# Patient Record
Sex: Male | Born: 1985 | Race: White | Hispanic: No | Marital: Single | Smoking: Never smoker
Health system: Southern US, Community
[De-identification: ages and names within clinical notes are randomized; demographics above are authoritative.]

## PROBLEM LIST (undated history)

## (undated) HISTORY — PX: OTHER SURGICAL HISTORY: SHX169

## (undated) HISTORY — PX: CHOLECYSTECTOMY: SHX55

---
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
## 2007-04-26 ENCOUNTER — Emergency Department (HOSPITAL_COMMUNITY): Admission: EM | Admit: 2007-04-26 | Discharge: 2007-04-26 | Payer: Self-pay | Admitting: Emergency Medicine

## 2013-10-06 ENCOUNTER — Emergency Department (HOSPITAL_COMMUNITY)
Admission: EM | Admit: 2013-10-06 | Discharge: 2013-10-06 | Disposition: A | Discharge status: Home / Self Care | Payer: Self-pay | Attending: Emergency Medicine | Admitting: Emergency Medicine

## 2013-10-06 ENCOUNTER — Encounter (HOSPITAL_COMMUNITY): Payer: Self-pay | Admitting: Emergency Medicine

## 2013-10-06 DIAGNOSIS — Z79899 Other long term (current) drug therapy: Secondary | ICD-10-CM | POA: Insufficient documentation

## 2013-10-06 DIAGNOSIS — R110 Nausea: Secondary | ICD-10-CM | POA: Insufficient documentation

## 2013-10-06 DIAGNOSIS — K219 Gastro-esophageal reflux disease without esophagitis: Secondary | ICD-10-CM | POA: Insufficient documentation

## 2013-10-06 LAB — COMPREHENSIVE METABOLIC PANEL
ALT: 18 U/L (ref 0–53)
AST: 15 U/L (ref 0–37)
Albumin: 3.8 g/dL (ref 3.5–5.2)
Alkaline Phosphatase: 37 U/L — ABNORMAL LOW (ref 39–117)
BUN: 18 mg/dL (ref 6–23)
Chloride: 102 mEq/L (ref 96–112)
GFR calc non Af Amer: >90 mL/min (ref >90)
Potassium: 4 mEq/L (ref 3.5–5.1)
Sodium: 137 mEq/L (ref 135–145)
Total Bilirubin: 0.9 mg/dL (ref 0.3–1.2)
Total Protein: 7.5 g/dL (ref 6.0–8.3)

## 2013-10-06 LAB — CBC WITH DIFFERENTIAL/PLATELET
Basophils Absolute: 0 10*3/uL (ref 0.0–0.1)
Basophils Relative: 0 % (ref 0–1)
Eosinophils Absolute: 0.1 10*3/uL (ref 0.0–0.7)
HCT: 44.5 % (ref 39.0–52.0)
Hemoglobin: 15.7 g/dL (ref 13.0–17.0)
MCHC: 35.3 g/dL (ref 30.0–36.0)
Monocytes Relative: 8 % (ref 3–12)
Neutro Abs: 4.3 10*3/uL (ref 1.7–7.7)
Neutrophils Relative %: 60 % (ref 43–77)
Platelets: 172 10*3/uL (ref 150–400)
RDW: 12.2 % (ref 11.5–15.5)

## 2013-10-06 LAB — LIPASE, BLOOD: Lipase: 33 U/L (ref 11–59)

## 2013-10-06 MED ORDER — ONDANSETRON HCL 4 MG/2ML IJ SOLN
4.0000 mg | Freq: Once | INTRAMUSCULAR | Status: AC
  Administered 2013-10-06: 4 mg via INTRAVENOUS
  Filled 2013-10-06: qty 2

## 2013-10-06 MED ORDER — GI COCKTAIL ~~LOC~~
30.0000 mL | Freq: Once | ORAL | Status: AC
  Administered 2013-10-06: 30 mL via ORAL
  Filled 2013-10-06: qty 30

## 2013-10-06 MED ORDER — OMEPRAZOLE 20 MG PO CPDR: 20.0000 mg | DELAYED_RELEASE_CAPSULE | Freq: Every day | ORAL | Status: DC

## 2013-10-06 MED ORDER — SODIUM CHLORIDE 0.9 % IV BOLUS (SEPSIS)
1000.0000 mL | Freq: Once | INTRAVENOUS | Status: AC
  Administered 2013-10-06: 1000 mL via INTRAVENOUS

## 2013-10-06 NOTE — ED Provider Notes (Signed)
CSN: [id number]     Arrival date & time 10/06/13  5621 History   None    Chief Complaint  Patient presents with  . Abdominal Pain    epigastric    (Consider location/radiation/quality/duration/timing/severity/associated sxs/prior Treatment) HPI  Patient presents to the ED with complaints of epigastric cramping/pain since 3:30am today. He states that he intermittently gets pains like these and will take a anti acid pill and get relief. This morning he took one and it didn't help. He also feels a bit nauseous. The cramping radiates into his back. He denies ever needing to go to the ER for similar symptoms and denies having had abdominal surgery in his medical history. He has not had any vomiting, diarrhea, coughing, sore throat, weakness, change in appetite. He says that for work he travels a lot and eats a lot of fast food. Last night he had BBQ.  History reviewed. No pertinent past medical history. History reviewed. No pertinent past surgical history. No family history on file. History  Substance Use Topics  . Smoking status: Never Smoker   . Smokeless tobacco: Not on file  . Alcohol Use: No    Review of Systems The patient denies anorexia, fever, weight loss,, vision loss, decreased hearing, hoarseness, chest pain, syncope, dyspnea on exertion, peripheral edema, balance deficits, hemoptysis, abdominal pain, melena, hematochezia, severe indigestion/heartburn, hematuria, incontinence, genital sores, muscle weakness, suspicious skin lesions, transient blindness, difficulty walking, depression, unusual weight change, abnormal bleeding, enlarged lymph nodes, angioedema, and breast masses.  Allergies  Review of patient's allergies indicates no known allergies.  Home Medications   Current Outpatient Rx  Name  Route  Sig  Dispense  Refill  . calcium carbonate (TUMS) 500 MG chewable tablet   Oral   Chew 2 tablets by mouth daily as needed for heartburn.         . famotidine (PEPCID) 20  MG tablet   Oral   Take 20 mg by mouth daily as needed for heartburn.         Marland Kitchen omeprazole (PRILOSEC) 20 MG capsule   Oral   Take 1 capsule (20 mg total) by mouth daily.   30 capsule   0    BP 160/93  Pulse 51  Temp(Src) 98.3 F (36.8 C) (Oral)  Resp 16  SpO2 100% Physical Exam  Nursing note and vitals reviewed. Constitutional: He appears well-developed and well-nourished. No distress.  HENT:  Head: Normocephalic and atraumatic.  Eyes: Pupils are equal, round, and reactive to light.  Neck: Normal range of motion. Neck supple.  Cardiovascular: Normal rate and regular rhythm.   Pulmonary/Chest: Effort normal.  Abdominal: Soft. Bowel sounds are normal. He exhibits no distension, no fluid wave and no ascites. There is tenderness (mild) in the epigastric area. There is no rigidity, no rebound, no guarding, no CVA tenderness, no tenderness at McBurney's point and negative Murphy's sign.  Neurological: He is alert.  Skin: Skin is warm and dry.    ED Course  Procedures (including critical care time) Labs Review Labs Reviewed  COMPREHENSIVE METABOLIC PANEL - Abnormal; Notable for the following:    Glucose, Bld 115 (*)    Alkaline Phosphatase 37 (*)    All other components within normal limits  CBC WITH DIFFERENTIAL  LIPASE, BLOOD   Imaging Review No results found.  EKG Interpretation     Ventricular Rate:  48 PR Interval:  153 QRS Duration: 113 QT Interval:  433 QTC Calculation: 387 R Axis:   -30 Text  Interpretation:  Sinus bradycardia Borderline IVCD with LAD Borderline T abnormalities, inferior leads No old tracing to compare            MDM   1. Acid reflux       Date: 10/06/2013  Rate: 48  Rhythm: sinus brady  QRS Axis: normal  Intervals: normal  ST/T Wave abnormalities: normal  Conduction Disutrbances:none  Narrative Interpretation:   Old EKG Reviewed: none available     Patient had 100% relief with the GI cocktail and Zofran. Feeling  much better. Labs are reassuring and I dont know feel that any imaging is warranted at this time. Referral to GI given and an Rx for Prilosec daily.  27 y.o.Gordon Robertson's evaluation in the Emergency Department is complete. It has been determined that no acute conditions requiring further emergency intervention are present at this time. The patient/guardian have been advised of the diagnosis and plan. We have discussed signs and symptoms that warrant return to the ED, such as changes or worsening in symptoms.  Vital signs are stable at discharge. Filed Vitals:   10/06/13 0627  BP: 160/93  Pulse: 51  Temp: 98.3 F (36.8 C)  Resp: 16    Patient/guardian has voiced understanding and agreed to follow-up with the PCP or specialist.      Dorthula Matas, PA-C 10/06/13 7829

## 2013-10-06 NOTE — Progress Notes (Signed)
Met Patient at bedside.Role of Case Manager explained to Patient . Patient verbalizes his understanding.Patient reports abdominal discomfort is the reason for today's ED visit. Patient Demographic sheet did not list a PCP though patient reports he is established with Dr Ricki Miller at [facility]Patients health Insurance does not start till  Next week.Patient provided with a prescription discount card for [facility] and resource sheet for the program.Education also provided on the [place] [address] and the  [employer]cription savings club.Patient receptive to today's education and reports he will follow up with his PCP.

## 2013-10-06 NOTE — ED Provider Notes (Signed)
Medical screening examination/treatment/procedure(s) were performed by non-physician practitioner and as supervising physician I was immediately available for consultation/collaboration.  EKG Interpretation     Ventricular Rate:  48 PR Interval:  153 QRS Duration: 113 QT Interval:  433 QTC Calculation: 387 R Axis:   -30 Text Interpretation:  Sinus bradycardia Borderline IVCD with LAD Borderline T abnormalities, inferior leads No old tracing to compare              Shanna Cisco, MD 10/06/13 1538

## 2013-10-06 NOTE — ED Notes (Signed)
Patient states epigastric pain starting tonight at 0330, patient states some associated nausea, patient with previous episodes of same, patient states intermittent pain radiating into back

## 2013-10-13 ENCOUNTER — Emergency Department (HOSPITAL_COMMUNITY)
Admission: EM | Admit: 2013-10-13 | Discharge: 2013-10-13 | Disposition: A | Discharge status: Home / Self Care | Payer: Self-pay | Attending: Emergency Medicine | Admitting: Emergency Medicine

## 2013-10-13 ENCOUNTER — Other Ambulatory Visit: Payer: Self-pay

## 2013-10-13 ENCOUNTER — Encounter (HOSPITAL_COMMUNITY): Payer: Self-pay | Admitting: Emergency Medicine

## 2013-10-13 ENCOUNTER — Emergency Department (HOSPITAL_COMMUNITY): Payer: Self-pay

## 2013-10-13 DIAGNOSIS — Z79899 Other long term (current) drug therapy: Secondary | ICD-10-CM | POA: Insufficient documentation

## 2013-10-13 DIAGNOSIS — K8020 Calculus of gallbladder without cholecystitis without obstruction: Secondary | ICD-10-CM | POA: Insufficient documentation

## 2013-10-13 DIAGNOSIS — R110 Nausea: Secondary | ICD-10-CM | POA: Insufficient documentation

## 2013-10-13 DIAGNOSIS — M545 Low back pain, unspecified: Secondary | ICD-10-CM | POA: Insufficient documentation

## 2013-10-13 DIAGNOSIS — K219 Gastro-esophageal reflux disease without esophagitis: Secondary | ICD-10-CM | POA: Insufficient documentation

## 2013-10-13 DIAGNOSIS — Z7982 Long term (current) use of aspirin: Secondary | ICD-10-CM | POA: Insufficient documentation

## 2013-10-13 LAB — CBC
HCT: 44.5 % (ref 39.0–52.0)
Hemoglobin: 16.3 g/dL (ref 13.0–17.0)
MCHC: 36.6 g/dL — ABNORMAL HIGH (ref 30.0–36.0)
MCV: 83 fL (ref 78.0–100.0)
Platelets: 201 10*3/uL (ref 150–400)
RDW: 12 % (ref 11.5–15.5)

## 2013-10-13 LAB — COMPREHENSIVE METABOLIC PANEL
ALT: 19 U/L (ref 0–53)
AST: 19 U/L (ref 0–37)
Albumin: 4.1 g/dL (ref 3.5–5.2)
Alkaline Phosphatase: 39 U/L (ref 39–117)
BUN: 17 mg/dL (ref 6–23)
CO2: 23 mEq/L (ref 19–32)
Creatinine, Ser: 0.8 mg/dL (ref 0.50–1.35)
GFR calc Af Amer: >90 mL/min (ref >90)
Glucose, Bld: 114 mg/dL — ABNORMAL HIGH (ref 70–99)
Potassium: 4.2 mEq/L (ref 3.5–5.1)
Total Bilirubin: 0.9 mg/dL (ref 0.3–1.2)
Total Protein: 8 g/dL (ref 6.0–8.3)

## 2013-10-13 LAB — LIPASE, BLOOD: Lipase: 19 U/L (ref 11–59)

## 2013-10-13 MED ORDER — GI COCKTAIL ~~LOC~~
30.0000 mL | Freq: Once | ORAL | Status: AC
  Administered 2013-10-13: 30 mL via ORAL
  Filled 2013-10-13: qty 30

## 2013-10-13 MED ORDER — SUCRALFATE 1 GM/10ML PO SUSP: 1.0000 g | Freq: Three times a day (TID) | ORAL | Status: AC

## 2013-10-13 MED ORDER — SODIUM CHLORIDE 0.9 % IV BOLUS (SEPSIS)
1000.0000 mL | Freq: Once | INTRAVENOUS | Status: AC
  Administered 2013-10-13: 1000 mL via INTRAVENOUS

## 2013-10-13 MED ORDER — OMEPRAZOLE 20 MG PO CPDR: 20.0000 mg | DELAYED_RELEASE_CAPSULE | Freq: Every day | ORAL | Status: AC

## 2013-10-13 MED ORDER — ONDANSETRON HCL 4 MG/2ML IJ SOLN
4.0000 mg | Freq: Once | INTRAMUSCULAR | Status: AC
  Administered 2013-10-13: 4 mg via INTRAVENOUS
  Filled 2013-10-13: qty 2

## 2013-10-13 MED ORDER — SUCRALFATE 1 GM/10ML PO SUSP
1.0000 g | Freq: Three times a day (TID) | ORAL | Status: DC
  Administered 2013-10-13: 1 g via ORAL
  Filled 2013-10-13 (×3): qty 10

## 2013-10-13 MED ORDER — HYDROCODONE-ACETAMINOPHEN 5-325 MG PO TABS: 1.0000 | ORAL_TABLET | Freq: Four times a day (QID) | ORAL | Status: AC | PRN

## 2013-10-13 MED ORDER — MORPHINE SULFATE 4 MG/ML IJ SOLN
4.0000 mg | Freq: Once | INTRAMUSCULAR | Status: AC
  Administered 2013-10-13: 4 mg via INTRAVENOUS
  Filled 2013-10-13: qty 1

## 2013-10-13 NOTE — ED Provider Notes (Signed)
ECG interpretation   Date: 10/13/2013  Rate: 46  Rhythm: sinus bradycardia  QRS Axis: normal  Intervals: normal  ST/T Wave abnormalities: normal  Conduction Disutrbances: none  Narrative Interpretation:   Old EKG Reviewed: No significant changes noted     Lyanne Co, MD 10/13/13 1147

## 2013-10-13 NOTE — ED Notes (Signed)
Upper abd pain and cp and lower back and has been vomiting all night

## 2013-10-13 NOTE — ED Provider Notes (Signed)
CSN: [id number]     Arrival date & time 10/13/13  1130 History   First MD Initiated Contact with Patient 10/13/13 1136     Chief Complaint  Patient presents with  . Chest Pain  . Abdominal Pain  . Back Pain   (Consider location/radiation/quality/duration/timing/severity/associated sxs/prior Treatment) HPI Comments:  The patient is a 27 year old male presenting to the emergency department for recurrent epigastric abdominal pain with radiation to back. Patient states he was evaluated one week ago in the emergency department and diagnosed with acid reflux and told to start Prilosec. Patient states he has been taken medication as prescribed, does note he missed one dose on Friday. He states this morning he woke up with worsening pain. Describing it as constant sharp without aggravating factors. Patient states his pain is alleviated with stretching. He endorses non-bloody non-bilious vomiting last evening w/ continued nausea today. Patient states he has continued to eat foods such as [place] and [place]. He states that he was told not to change his diet, despite review of discharge instructions. He has also not followed up with GI.   Patient is a 28 y.o. male presenting with chest pain, abdominal pain, and back pain.  Chest Pain Associated symptoms: abdominal pain, back pain and nausea   Associated symptoms: no fever, no shortness of breath and not vomiting   Abdominal Pain Associated symptoms: chest pain and nausea   Associated symptoms: no chills, no fever, no shortness of breath and no vomiting   Back Pain Associated symptoms: abdominal pain and chest pain   Associated symptoms: no fever     History reviewed. No pertinent past medical history. History reviewed. No pertinent past surgical history. No family history on file. History  Substance Use Topics  . Smoking status: Never Smoker   . Smokeless tobacco: Not on file  . Alcohol Use: No    Review of Systems  Constitutional: Negative  for fever and chills.  Respiratory: Negative for shortness of breath.   Cardiovascular: Positive for chest pain.  Gastrointestinal: Positive for nausea and abdominal pain. Negative for vomiting.  Musculoskeletal: Positive for back pain.  All other systems reviewed and are negative.    Allergies  Review of patient's allergies indicates no known allergies.  Home Medications   Current Outpatient Rx  Name  Route  Sig  Dispense  Refill  . aspirin 325 MG EC tablet   Oral   Take 650 mg by mouth every 6 (six) hours as needed for pain.         . calcium carbonate (TUMS) 500 MG chewable tablet   Oral   Chew 2 tablets by mouth daily as needed for heartburn.         . famotidine (PEPCID) 20 MG tablet   Oral   Take 20 mg by mouth daily as needed for heartburn.         Marland Kitchen ibuprofen (ADVIL,MOTRIN) 200 MG tablet   Oral   Take 200 mg by mouth every 6 (six) hours as needed for pain.         . ranitidine (ZANTAC) 150 MG tablet   Oral   Take 150 mg by mouth daily as needed for heartburn. heartburn         . HYDROcodone-acetaminophen (NORCO/VICODIN) 5-325 MG per tablet   Oral   Take 1-2 tablets by mouth every 6 (six) hours as needed for pain.   15 tablet   0   . omeprazole (PRILOSEC) 20 MG capsule   Oral  Take 1 capsule (20 mg total) by mouth daily.   30 capsule   0   . sucralfate (CARAFATE) 1 GM/10ML suspension   Oral   Take 10 mLs (1 g total) by mouth 4 (four) times daily -  with meals and at bedtime.   420 mL   0    BP 135/66  Pulse 53  Temp(Src) 98.3 F (36.8 C) (Oral)  Resp 18  Ht 6' (1.829 m)  Wt 245 lb 4.8 oz (111.267 kg)  BMI 33.26 kg/m2  SpO2 99% Physical Exam  Constitutional: He is oriented to person, place, and time. He appears well-developed and well-nourished. No distress.  HENT:  Head: Normocephalic and atraumatic.  Right Ear: External ear normal.  Left Ear: External ear normal.  Nose: Nose normal.  Mouth/Throat: Oropharynx is clear and moist.   Eyes: Conjunctivae are normal.  Neck: Normal range of motion. Neck supple.  Cardiovascular: Normal rate, regular rhythm and normal heart sounds.   Pulmonary/Chest: Effort normal and breath sounds normal. No respiratory distress. He exhibits no tenderness.  Abdominal: Soft. Bowel sounds are normal. He exhibits no distension and no mass. There is tenderness in the epigastric area. There is no rigidity, no rebound, no guarding, no CVA tenderness, no tenderness at McBurney's point and negative Murphy's sign.  Musculoskeletal: Normal range of motion. He exhibits no edema.       Cervical back: Normal.       Thoracic back: Normal.       Lumbar back: Normal.  Neurological: He is alert and oriented to person, place, and time.  Skin: Skin is warm and dry. He is not diaphoretic.    ED Course  Procedures (including critical care time) Medications  sucralfate (CARAFATE) 1 GM/10ML suspension 1 g (1 g Oral Given 10/13/13 1612)  gi cocktail (Maalox,Lidocaine,Donnatal) (30 mLs Oral Given 10/13/13 1250)  ondansetron (ZOFRAN) injection 4 mg (4 mg Intravenous Given 10/13/13 1248)  sodium chloride 0.9 % bolus 1,000 mL (0 mLs Intravenous Stopped 10/13/13 1718)  morphine 4 MG/ML injection 4 mg (4 mg Intravenous Given 10/13/13 1326)    Labs Review Labs Reviewed  CBC - Abnormal; Notable for the following:    WBC 13.1 (*)    MCHC 36.6 (*)    All other components within normal limits  COMPREHENSIVE METABOLIC PANEL - Abnormal; Notable for the following:    Glucose, Bld 114 (*)    All other components within normal limits  LIPASE, BLOOD   Imaging Review US Abdomen Complete  10/13/2013   CLINICAL DATA:  Abdominal pain with nausea and vomiting.  EXAM: ULTRASOUND ABDOMEN COMPLETE  COMPARISON:  None.  FINDINGS: Gallbladder  Moderate cholelithiasis including a 1.6 cm stone. The thickened gallbladder wall measuring 5.2 mm. Negative sonographic Murphy sign.  Common bile duct  Diameter: 5.2 mm.  Liver  No focal lesion  identified.  Minimal fatty infiltration.  IVC  No abnormality visualized.  Pancreas  Visualized portion unremarkable.  Spleen  Size and appearance within normal limits.  Right Kidney  Length: 12.7 cm. Echogenicity within normal limits. No mass or hydronephrosis visualized.  Left Kidney  Length: 10.4 cm. Echogenicity within normal limits. No mass or hydronephrosis visualized.  Abdominal aorta  No aneurysm visualized.  IMPRESSION: Moderate cholelithiasis with moderate gallbladder wall thickening. Recommend clinical correlation for acute cholecystitis.  Minimal fatty infiltration of the liver.   Electronically Signed   By: Elberta Fortis M.D.   On: 10/13/2013 16:11    EKG Interpretation   None  MDM   1. GERD (gastroesophageal reflux disease)   2. Cholelithiasis    Afebrile, NAD, non-toxic appearing, AAOx4.   1) GERD: Patient w/ epigastric pain w/ radiation into chest. Abdomen soft, minimally tender in epigastric region, nondistended, no rebound or guarding or rigidity noted. Negative Murphy sign. Patient resting comfortably, tolerating by mouth intake well. Patient does not need series or sepsis criteria. EKG unremarkable. Patient has been non-compliant with dietary suggestions and medications provided from ED visit last week. Advised GI f/u.   2) Cholelithiasis: US revealed cholelithiasis. No clinical concern for cholecystitis at this time. Will give f/u for General Surgery. Pain medication indicated.   Return precautions discussed. Patient is agreeable to plan. Parent is agreeable to plan. Patient is stable at time of discharge. Patient d/w with Dr. Patria Mane, agrees with plan.        Jeannetta Ellis, PA-C 10/13/13 1806

## 2013-10-13 NOTE — ED Notes (Signed)
Family member at desk sts pt. Pain has moved up to chest and is 10/10. Candise Bowens PA made aware.

## 2013-10-14 NOTE — ED Provider Notes (Signed)
Medical screening examination/treatment/procedure(s) were performed by non-physician practitioner and as supervising physician I was immediately available for consultation/collaboration.  EKG Interpretation   None         Lyanne Co, MD 10/14/13 [phone]

## 2015-12-09 ENCOUNTER — Other Ambulatory Visit: Payer: Self-pay | Admitting: Internal Medicine

## 2015-12-09 DIAGNOSIS — R1011 Right upper quadrant pain: Secondary | ICD-10-CM

## 2015-12-17 ENCOUNTER — Ambulatory Visit
Admission: RE | Admit: 2015-12-17 | Discharge: 2015-12-17 | Disposition: A | Discharge status: Home / Self Care | Payer: 59 | Source: Ambulatory Visit | Attending: Internal Medicine | Admitting: Internal Medicine

## 2015-12-17 ENCOUNTER — Other Ambulatory Visit: Payer: Self-pay | Admitting: Internal Medicine

## 2015-12-17 DIAGNOSIS — R1011 Right upper quadrant pain: Secondary | ICD-10-CM

## 2016-01-10 ENCOUNTER — Other Ambulatory Visit: Payer: Self-pay | Admitting: General Surgery

## 2016-07-01 ENCOUNTER — Encounter (HOSPITAL_COMMUNITY): Payer: Self-pay

## 2016-07-01 ENCOUNTER — Emergency Department (HOSPITAL_COMMUNITY)
Admission: EM | Admit: 2016-07-01 | Discharge: 2016-07-01 | Disposition: A | Discharge status: Home / Self Care | Payer: Worker's Compensation | Attending: Emergency Medicine | Admitting: Emergency Medicine

## 2016-07-01 ENCOUNTER — Emergency Department (HOSPITAL_COMMUNITY): Payer: Worker's Compensation

## 2016-07-01 DIAGNOSIS — S62634A Displaced fracture of distal phalanx of right ring finger, initial encounter for closed fracture: Secondary | ICD-10-CM | POA: Insufficient documentation

## 2016-07-01 DIAGNOSIS — Z7982 Long term (current) use of aspirin: Secondary | ICD-10-CM | POA: Diagnosis not present

## 2016-07-01 DIAGNOSIS — Z79899 Other long term (current) drug therapy: Secondary | ICD-10-CM | POA: Diagnosis not present

## 2016-07-01 DIAGNOSIS — S62609A Fracture of unspecified phalanx of unspecified finger, initial encounter for closed fracture: Secondary | ICD-10-CM

## 2016-07-01 DIAGNOSIS — W230XXA Caught, crushed, jammed, or pinched between moving objects, initial encounter: Secondary | ICD-10-CM | POA: Diagnosis not present

## 2016-07-01 DIAGNOSIS — Y9389 Activity, other specified: Secondary | ICD-10-CM | POA: Insufficient documentation

## 2016-07-01 DIAGNOSIS — S60419A Abrasion of unspecified finger, initial encounter: Secondary | ICD-10-CM

## 2016-07-01 DIAGNOSIS — Y999 Unspecified external cause status: Secondary | ICD-10-CM | POA: Insufficient documentation

## 2016-07-01 DIAGNOSIS — S6991XA Unspecified injury of right wrist, hand and finger(s), initial encounter: Secondary | ICD-10-CM | POA: Diagnosis present

## 2016-07-01 DIAGNOSIS — Y929 Unspecified place or not applicable: Secondary | ICD-10-CM | POA: Diagnosis not present

## 2016-07-01 DIAGNOSIS — S6010XA Contusion of unspecified finger with damage to nail, initial encounter: Secondary | ICD-10-CM

## 2016-07-01 MED ORDER — TETANUS-DIPHTH-ACELL PERTUSSIS 5-2.5-18.5 LF-MCG/0.5 IM SUSP
0.5000 mL | Freq: Once | INTRAMUSCULAR | Status: AC
  Administered 2016-07-01: 0.5 mL via INTRAMUSCULAR
  Filled 2016-07-01: qty 0.5

## 2016-07-01 NOTE — Discharge Instructions (Signed)
Finger Fracture °Fractures of fingers are breaks in the bones of the fingers. There are many types of fractures. There are different ways of treating these fractures. Your health care provider will discuss the best way to treat your fracture. °CAUSES °Traumatic injury is the main cause of broken fingers. These include: °· Injuries while playing sports. °· Workplace injuries. °· Falls. °RISK FACTORS °Activities that can increase your risk of finger fractures include: °· Sports. °· Workplace activities that involve machinery. °· A condition called osteoporosis, which can make your bones less dense and cause them to fracture more easily. °SIGNS AND SYMPTOMS °The main symptoms of a broken finger are pain and swelling within 15 minutes after the injury. Other symptoms include: °· Bruising of your finger. °· Stiffness of your finger. °· Numbness of your finger. °· Exposed bones (compound fracture) if the fracture is severe. °DIAGNOSIS  °The best way to diagnose a broken bone is with X-ray imaging. Additionally, your health care provider will use this X-ray image to evaluate the position of the broken finger bones.  °TREATMENT  °Finger fractures can be treated with:  °· Nonreduction--This means the bones are in place. The finger is splinted without changing the positions of the bone pieces. The splint is usually left on for about a week to 10 days. This will depend on your fracture and what your health care provider thinks. °· Closed reduction--The bones are put back into position without using surgery. The finger is then splinted. °· Open reduction and internal fixation--The fracture site is opened. Then the bone pieces are fixed into place with pins or some type of hardware. This is seldom required. It depends on the severity of the fracture. °HOME CARE INSTRUCTIONS  °· Follow your health care provider's instructions regarding activities, exercises, and physical therapy. °· Only take over-the-counter or prescription  medicines for pain, discomfort, or fever as directed by your health care provider. °SEEK MEDICAL CARE IF: °You have pain or swelling that limits the motion or use of your fingers. °SEEK IMMEDIATE MEDICAL CARE IF:  °Your finger becomes numb. °MAKE SURE YOU:  °· Understand these instructions. °· Will watch your condition. °· Will get help right away if you are not doing well or get worse. °  °This information is not intended to replace advice given to you by your health care provider. Make sure you discuss any questions you have with your health care provider. °  °Document Released: 03/11/2001 Document Revised: 09/17/2013 Document Reviewed: 07/09/2013 °Elsevier Interactive Patient Education ©2016 [employer] ° °Subungual Hematoma °A subungual hematoma is a pocket of blood that collects under the fingernail or toenail. The pressure created by the blood under the nail can cause pain. °CAUSES  °A subungual hematoma occurs when an injury to the finger or toe causes a blood vessel beneath the nail to break. The injury can occur from a direct blow such as slamming a finger in a door. It can also occur from a repeated injury such as pressure on the foot in a shoe while running. A subungual hematoma is sometimes called runner's toe or tennis toe. °SYMPTOMS  °· Blue or dark blue skin under the nail. °· Pain or throbbing in the injured area. °DIAGNOSIS  °Your caregiver can determine whether you have a subungual hematoma based on your history and a physical exam. If your caregiver thinks you might have a broken (fractured) bone, X-rays may be taken. °TREATMENT  °Hematomas usually go away on their own over time. Your caregiver   may make a hole in the nail to drain the blood. Draining the blood is painless and usually provides significant relief from pain and throbbing. The nail usually grows back normally after this procedure. In some cases, the nail may need to be removed. This is done if there is a cut under the nail that  requires stitches (sutures). HOME CARE INSTRUCTIONS   Put ice on the injured area.  Put ice in a plastic bag.  Place a towel between your skin and the bag.  Leave the ice on for 15-20 minutes, 03-04 times a day for the first 1 to 2 days.  Elevate the injured area to help decrease pain and swelling.  If you were given a bandage, wear it for as long as directed by your caregiver.  If part of your nail falls off, trim the remaining nail gently. This prevents the nail from catching on something and causing further injury.  Only take over-the-counter or prescription medicines for pain, discomfort, or fever as directed by your caregiver. SEEK IMMEDIATE MEDICAL CARE IF:   You have redness or swelling around the nail.  You have yellowish-white fluid (pus) coming from the nail.  Your pain is not controlled with medicine.  You have a fever. MAKE SURE YOU:  Understand these instructions.  Will watch your condition.  Will get help right away if you are not doing well or get worse.   This information is not intended to replace advice given to you by your health care provider. Make sure you discuss any questions you have with your health care provider.   Document Released: 11/24/2000 Document Revised: 02/19/2012 Document Reviewed: 04/14/2015 Elsevier Interactive Patient Education 2016 [employer] Inc[employer]  Abrasion An abrasion is a cut or scrape on the outer surface of your skin. An abrasion does not extend through all of the layers of your skin. It is important to care for your abrasion properly to prevent infection. CAUSES Most abrasions are caused by falling on or gliding across the ground or another surface. When your skin rubs on something, the outer and inner layer of skin rubs off.  SYMPTOMS A cut or scrape is the main symptom of this condition. The scrape may be bleeding, or it may appear red or pink. If there was an associated fall, there may be an underlying bruise. DIAGNOSIS An  abrasion is diagnosed with a physical exam. TREATMENT Treatment for this condition depends on how large and deep the abrasion is. Usually, your abrasion will be cleaned with water and mild soap. This removes any dirt or debris that may be stuck. An antibiotic ointment may be applied to the abrasion to help prevent infection. A bandage (dressing) may be placed on the abrasion to keep it clean. You may also need a tetanus shot. HOME CARE INSTRUCTIONS Medicines  Take or apply medicines only as directed by your health care provider.  If you were prescribed an antibiotic ointment, finish all of it even if you start to feel better. Wound Care  Clean the wound with mild soap and water 2-3 times per day or as directed by your health care provider. Pat your wound dry with a clean towel. Do not rub it.  There are many different ways to close and cover a wound. Follow instructions from your health care provider about:  Wound care.  Dressing changes and removal.  Check your wound every day for signs of infection. Watch for:  Redness, swelling, or pain.  Fluid, blood, or pus. General  Instructions  Keep the dressing dry as directed by your health care provider. Do not take baths, swim, use a hot tub, or do anything that would put your wound underwater until your health care provider approves.  If there is swelling, raise (elevate) the injured area above the level of your heart while you are sitting or lying down.  Keep all follow-up visits as directed by your health care provider. This is important. SEEK MEDICAL CARE IF:  You received a tetanus shot and you have swelling, severe pain, redness, or bleeding at the injection site.  Your pain is not controlled with medicine.  You have increased redness, swelling, or pain at the site of your wound. SEEK IMMEDIATE MEDICAL CARE IF:  You have a red streak going away from your wound.  You have a fever.  You have fluid, blood, or pus coming from  your wound.  You notice a bad smell coming from your wound or your dressing.   This information is not intended to replace advice given to you by your health care provider. Make sure you discuss any questions you have with your health care provider.   Document Released: 09/06/2005 Document Revised: 08/18/2015 Document Reviewed: 11/25/2014 Elsevier Interactive Patient Education [employer]

## 2016-07-01 NOTE — ED Provider Notes (Signed)
CSN: [id number]     Arrival date & time 07/01/16  4098 History   First MD Initiated Contact with Patient 07/01/16 0750     Chief Complaint  Patient presents with  . Finger Injury     (Consider location/radiation/quality/duration/timing/severity/associated sxs/prior Treatment) HPI   Gordon Robertson is a 30 y.o. male who injured his right ring finger yesterday, pinching it between 2 pieces of metal. He presents for evaluation of pain and decreased mobility of the right fourth finger. No other complaints. There are no other known modifying factors.   History reviewed. No pertinent past medical history. Past Surgical History  Procedure Laterality Date  . Cholecystectomy    . Arm surgery     History reviewed. No pertinent family history. Social History  Substance Use Topics  . Smoking status: Never Smoker   . Smokeless tobacco: None  . Alcohol Use: No    Review of Systems  All other systems reviewed and are negative.     Allergies  Review of patient's allergies indicates no known allergies.  Home Medications   Prior to Admission medications   Medication Sig Start Date End Date Taking? Authorizing Provider  aspirin 325 MG EC tablet Take 650 mg by mouth every 6 (six) hours as needed for pain.    Historical Provider, MD  calcium carbonate (TUMS) 500 MG chewable tablet Chew 2 tablets by mouth daily as needed for heartburn.    Historical Provider, MD  famotidine (PEPCID) 20 MG tablet Take 20 mg by mouth daily as needed for heartburn.    Historical Provider, MD  HYDROcodone-acetaminophen (NORCO/VICODIN) 5-325 MG per tablet Take 1-2 tablets by mouth every 6 (six) hours as needed for pain. 10/13/13   Gordon Piepenbrink, PA-C  ibuprofen (ADVIL,MOTRIN) 200 MG tablet Take 200 mg by mouth every 6 (six) hours as needed for pain.    Historical Provider, MD  omeprazole (PRILOSEC) 20 MG capsule Take 1 capsule (20 mg total) by mouth daily. 10/13/13   Gordon Piepenbrink, PA-C  ranitidine  (ZANTAC) 150 MG tablet Take 150 mg by mouth daily as needed for heartburn. heartburn    Historical Provider, MD  sucralfate (CARAFATE) 1 GM/10ML suspension Take 10 mLs (1 g total) by mouth 4 (four) times daily -  with meals and at bedtime. 10/13/13   Gordon Piepenbrink, PA-C   BP 120/79 mmHg  Pulse 53  Temp(Src) 98 F (36.7 C) (Oral)  Resp 17  Ht 6' (1.829 m)  Wt 250 lb (113.399 kg)  BMI 33.90 kg/m2  SpO2 99% Physical Exam  Constitutional: He is oriented to person, place, and time. He appears well-developed and well-nourished.  HENT:  Head: Normocephalic and atraumatic.  Right Ear: External ear normal.  Left Ear: External ear normal.  Eyes: Conjunctivae and EOM are normal. Pupils are equal, round, and reactive to light.  Neck: Normal range of motion and phonation normal. Neck supple.  Cardiovascular: Normal rate.   Pulmonary/Chest: Effort normal. He exhibits no bony tenderness.  Musculoskeletal:  Right fourth finger with distal swelling, and and dorsally. Small subungual hematoma, without elevation of the nail. Decreased range of motion right fourth DIP joint secondary to pain. No deformity of the right fourth finger. Small abrasions of the distal right fourth finger without deep laceration or active bleeding.  Neurological: He is alert and oriented to person, place, and time. No cranial nerve deficit or sensory deficit. He exhibits normal muscle tone. Coordination normal.  Skin: Skin is warm, dry and intact.  Psychiatric: He  has a normal mood and affect. His behavior is normal. Judgment and thought content normal.  Nursing note and vitals reviewed.   ED Course  Procedures (including critical care time)   Medications  Tdap (BOOSTRIX) injection 0.5 mL (0.5 mLs Intramuscular Given 07/01/16 0817)    Patient Vitals for the past 24 hrs:  BP Temp Temp src Pulse Resp SpO2 Height Weight  07/01/16 0906 120/79 mmHg - - (!) 53 17 99 % - -  07/01/16 0739 111/74 mmHg 98 F (36.7 C) Oral  (!) 56 16 100 % 6' (1.829 m) 250 lb (113.399 kg)   Patient fitted with finger splint, under my direction, applied by nursing  At discharge- Reevaluation with update and discussion. After initial assessment and treatment, an updated evaluation reveals he remains comfortable. Findings discussed with patient, all questions answered. Gordon Robertson L     Labs Review Labs Reviewed - No data to display  Imaging Review Dg Finger Ring Right  07/01/2016  CLINICAL DATA:  30 year old male status post crush injury to the distal aspect of the right ring finger EXAM: RIGHT RING FINGER 2+V COMPARISON:  None FINDINGS: Comminuted fracture of the distal tuft of the distal phalanx of the right ring finger. There is associated surrounding soft tissue swelling. The remaining visualized bones and joints are unremarkable. IMPRESSION: Comminuted fracture of the tuft of the distal phalanx of the right ring finger. Electronically Signed   By: Gordon Robertson M.D.   On: 07/01/2016 08:32   I have personally reviewed and evaluated these images and lab results as part of my medical decision-making.   EKG Interpretation None      MDM   Final diagnoses:  Finger fracture, closed, initial encounter  Subungual hematoma of digit of hand, initial encounter  Abrasion of finger, initial encounter    Finger injury with contusion, subungual hematoma and fracture. Fracture is not considered open. No urgent treatment required, in emergency department setting. He does not need referral to orthopedics, for this illness, unless he develops problems.  Nursing Notes Reviewed/ Care Coordinated Applicable Imaging Reviewed Interpretation of Laboratory Data incorporated into ED treatment  The patient appears reasonably screened and/or stabilized for discharge and I doubt any other medical condition or other [facility] requiring further screening, evaluation, or treatment in the ED at this time prior to discharge.  Plan: Home  Medications- OTC analgesia; Home Treatments- splint prn, expect nail to fall off; return here if the recommended treatment, does not improve the symptoms; Recommended follow up- PCP prn     Gordon Bale, MD 07/01/16 1515

## 2016-07-01 NOTE — ED Notes (Signed)
Patient stated he was putting a lug on a tractor trailer and smashed his right ring finger. Patient has swelling and bruising.

## 2016-07-01 NOTE — ED Notes (Signed)
Pt transported to xray 

## 2016-07-13 ENCOUNTER — Ambulatory Visit (INDEPENDENT_AMBULATORY_CARE_PROVIDER_SITE_OTHER): Payer: 59

## 2016-07-13 ENCOUNTER — Encounter (HOSPITAL_COMMUNITY): Payer: Self-pay | Admitting: Emergency Medicine

## 2016-07-13 ENCOUNTER — Ambulatory Visit (HOSPITAL_COMMUNITY)
Admission: EM | Admit: 2016-07-13 | Discharge: 2016-07-13 | Disposition: A | Discharge status: Home / Self Care | Payer: 59 | Attending: Family Medicine | Admitting: Family Medicine

## 2016-07-13 DIAGNOSIS — S62609G Fracture of unspecified phalanx of unspecified finger, subsequent encounter for fracture with delayed healing: Secondary | ICD-10-CM

## 2016-07-13 DIAGNOSIS — W5501XA Bitten by cat, initial encounter: Secondary | ICD-10-CM

## 2016-07-13 MED ORDER — AMOXICILLIN-POT CLAVULANATE 875-125 MG PO TABS: 1.0000 | ORAL_TABLET | Freq: Two times a day (BID) | ORAL | 0 refills | Status: AC

## 2016-07-13 NOTE — ED Triage Notes (Signed)
Patient reports a cat at his workplace that he is somewhat familiar with.  This morning the cat was panting, favoring a leg, not sure what happened to animal.  Drove animal to the vet.  When getting animal out of car, cat bit left index finger.   Patient reports cat at vet, cat did die, and vet told him cat being tested for rabies and he will let patient know the outcome.   Discussed that typically we do not wait for results, rabies is initiated same day as event.  Also discussed the hazards of animal bite unattended.  Animal bite not the same as his typical cuts at his workplace.   Patient agreeable to see a provider.

## 2016-07-13 NOTE — Discharge Instructions (Signed)
Take augmentin for 7 days to prevent infection from developing in the cat bite wound. If you notice worsening of the wound, seek medical attention right away. Proceed to the ER if you are told the cat had rabies.   Your xray did not appear to show very much healing in your finger fracture. You may return to work as tolerated, but will need to follow up with a hand surgeon in about 2 weeks to reevaluate the finger.

## 2016-07-13 NOTE — ED Provider Notes (Signed)
[facility]    CSN: [id number] Arrival date & time: 07/13/16  1118  First Provider Contact:  First MD Initiated Contact with Patient 07/13/16 1256     History   Chief Complaint Chief Complaint  Patient presents with  . Animal Bite    HPI Gordon Robertson is a 30 y.o. male.   HPI Reports seeing the "shop cat" at his work limping, so took it to the vet this morning. In the process, the cat bit his left index finger drawing blood and leaving a small wound. He immediately washed his hands with soap and water. The cat subsequently died and is being assessed post-mortem for rabies. He will be contacted if this returns positive, and knows to proceed to the ED for PEP. He denies any other wounds or illness.   Tetanus UTD as of 7/23 when he was evaluated in [facility] for crush injury of right ring finger which occurred at work. A fracture was found on XR and he reports improving pain since that time. His worker's comp would like a reevaluation prior to returning to full duty.   History reviewed. No pertinent past medical history.  There are no active problems to display for this patient.   Past Surgical History:  Procedure Laterality Date  . arm surgery    . CHOLECYSTECTOMY     Home Medications    Prior to Admission medications   Medication Sig Start Date End Date Taking? Authorizing Provider  amoxicillin-clavulanate (AUGMENTIN) 875-125 MG tablet Take 1 tablet by mouth every 12 (twelve) hours. 07/13/16   Tyrone Nine, MD  aspirin 325 MG EC tablet Take 650 mg by mouth every 6 (six) hours as needed for pain.    Historical Provider, MD  calcium carbonate (TUMS) 500 MG chewable tablet Chew 2 tablets by mouth daily as needed for heartburn.    Historical Provider, MD  famotidine (PEPCID) 20 MG tablet Take 20 mg by mouth daily as needed for heartburn.    Historical Provider, MD  HYDROcodone-acetaminophen (NORCO/VICODIN) 5-325 MG per tablet Take 1-2 tablets by mouth every 6 (six) hours as  needed for pain. 10/13/13   Jennifer Piepenbrink, PA-C  ibuprofen (ADVIL,MOTRIN) 200 MG tablet Take 200 mg by mouth every 6 (six) hours as needed for pain.    Historical Provider, MD  omeprazole (PRILOSEC) 20 MG capsule Take 1 capsule (20 mg total) by mouth daily. 10/13/13   Jennifer Piepenbrink, PA-C  ranitidine (ZANTAC) 150 MG tablet Take 150 mg by mouth daily as needed for heartburn. heartburn    Historical Provider, MD  sucralfate (CARAFATE) 1 GM/10ML suspension Take 10 mLs (1 g total) by mouth 4 (four) times daily -  with meals and at bedtime. 10/13/13   Francee Piccolo, PA-C    Family History No family history on file.  Social History Social History  Substance Use Topics  . Smoking status: Never Smoker  . Smokeless tobacco: Never Used  . Alcohol use No   Allergies   Review of patient's allergies indicates no known allergies.  Review of Systems Review of Systems No fevers, aquaphobia.  Physical Exam Triage Vital Signs ED Triage Vitals [07/13/16 1302]  Enc Vitals Group     BP 125/80     Pulse Rate (!) 58     Resp 16     Temp 98.2 F (36.8 C)     Temp Source Oral     SpO2 96 %     Weight  Height      Head Circumference      Peak Flow      Pain Score      Pain Loc      Pain Edu?      Excl. in GC?    No data found.  Updated Vital Signs BP 125/80 (BP Location: Left Arm)   Pulse (!) 58   Temp 98.2 F (36.8 C) (Oral)   Resp 16   SpO2 96%    Physical Exam  Constitutional: He is oriented to person, place, and time. He appears well-developed and well-nourished. No distress.  Eyes: EOM are normal. Pupils are equal, round, and reactive to light. No scleral icterus.  Neck: Neck supple. No JVD present.  Cardiovascular: Normal rate, regular rhythm, normal heart sounds and intact distal pulses.   No murmur heard. Pulmonary/Chest: Effort normal and breath sounds normal. No respiratory distress.  Abdominal: Soft. Bowel sounds are normal. He exhibits no  distension. There is no tenderness.  Musculoskeletal: Normal range of motion. He exhibits no edema or tenderness.  Right 4th fingerwith mild tenderness to palpation of distal aspect, subungual hematoma with nail intact. Cap refill < 2 sec, sensation intact.   Lymphadenopathy:    He has no cervical adenopathy.  Neurological: He is alert and oriented to person, place, and time. He exhibits normal muscle tone.  Skin: Skin is warm and dry.  2x ~ 0.5cm linear, now closed wounds: on left index finger distal volar aspect, and lateral dorsal aspect. without drainage or erythema or tenderness or swelling. cap refill < 2 sec, sensation intact.  Vitals reviewed.  UC Treatments / Results  Labs (all labs ordered are listed, but only abnormal results are displayed) Labs Reviewed - No data to display  EKG  EKG Interpretation None       Radiology Dg Finger Ring Right  Result Date: 07/13/2016 CLINICAL DATA:  Crush injury 2 weeks ago of the right fourth single distal phalanx, followup EXAM: RIGHT RING FINGER 2+V COMPARISON:  Right fourth finger images of 07/01/2016 FINDINGS: The comminuted slightly displaced fracture of the tuft of the distal phalanx of the right fourth digit is unchanged. No callus formation is seen. IMPRESSION: No change in the comminuted fracture of the tuft of the distal phalanx. No callus. Electronically Signed   By: Dwyane Dee M.D.   On: 07/13/2016 13:22    Procedures Procedures (including critical care time)  Medications Ordered in UC Medications - No data to display   Initial Impression / Assessment and Plan / UC Course  I have reviewed the triage vital signs and the nursing notes.  Pertinent labs & imaging results that were available during my care of the patient were reviewed by me and considered in my medical decision making (see chart for details).  Final Clinical Impressions(s) / UC Diagnoses   Final diagnoses:  Cat bite, initial encounter  Finger fracture,  with delayed healing, subsequent encounter   Healthy 31 y.o. male presenting ~ 3 hours after cat bite to left index finger without significant penetration. Cat is undergoing post-mortem eval for rabies, and PEP will be administered if needed. Tetanus up to date.  - Rx augmentin prophylaxis - Return if evidence of infection develops  Right 4th finger with fracture from crush injury 7/23, clinically not very significant at this time, though  XR's showing no callus formation over nearly 2 weeks. May return to work as tolerated, but will follow up with hand surgeon for reevaluation.  New Prescriptions New Prescriptions   AMOXICILLIN-CLAVULANATE (AUGMENTIN) 875-125 MG TABLET    Take 1 tablet by mouth every 12 (twelve) hours.     Tyrone Nine, MD 07/13/16 [phone]

## 2018-01-22 DIAGNOSIS — Z0000 Encounter for general adult medical examination without abnormal findings: Secondary | ICD-10-CM | POA: Diagnosis not present

## 2018-01-25 DIAGNOSIS — Z0000 Encounter for general adult medical examination without abnormal findings: Secondary | ICD-10-CM | POA: Diagnosis not present

## 2018-01-27 IMAGING — CR DG FINGER RING 2+V*R*
3 series · 3 of 3 positions shown · non-contrast
Comparison: None

CLINICAL DATA: 29-year-old male status post crush injury to the
distal aspect of the right ring finger

EXAM:
RIGHT RING FINGER 2+V

[x finger pa right]
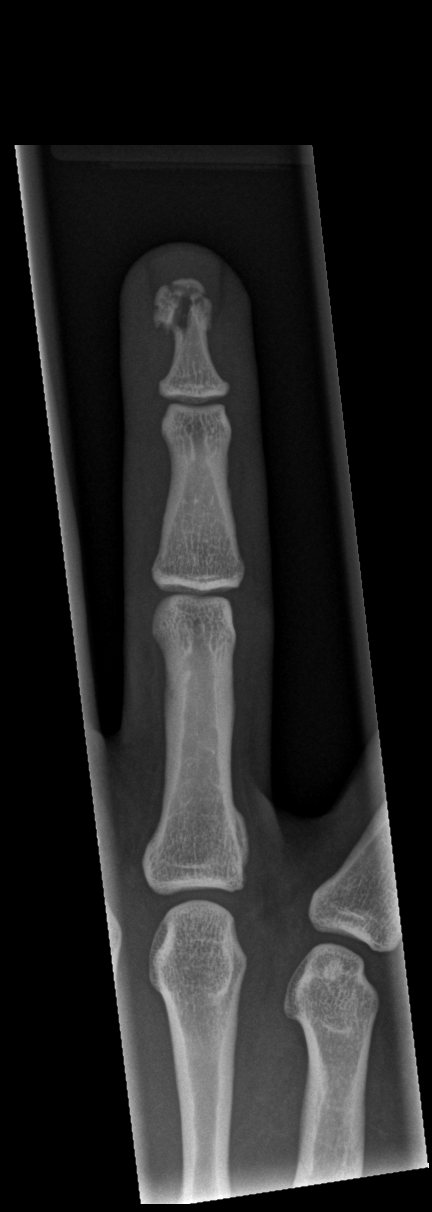

[x finger obl right]
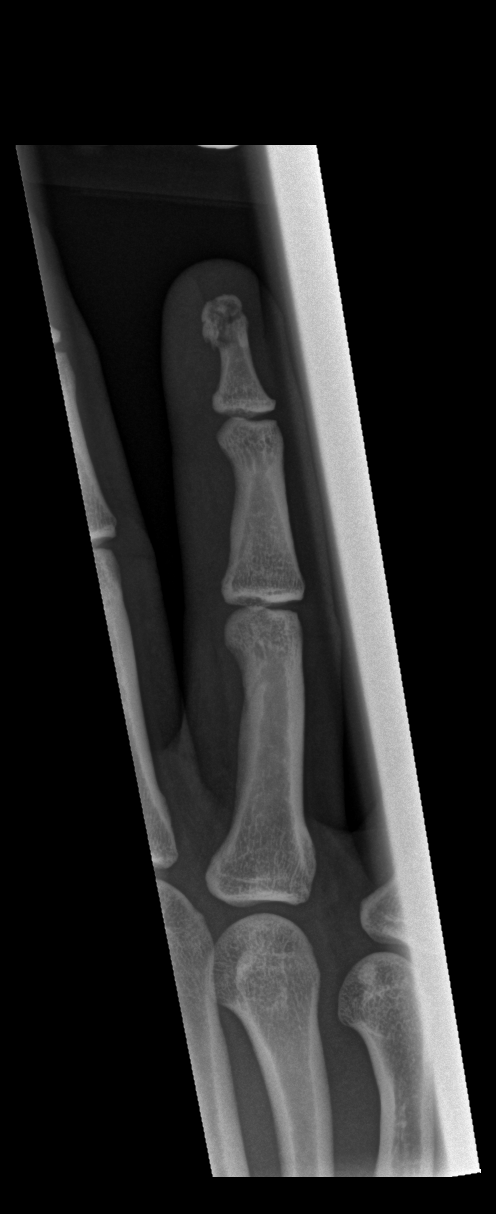

[x finger lat right]
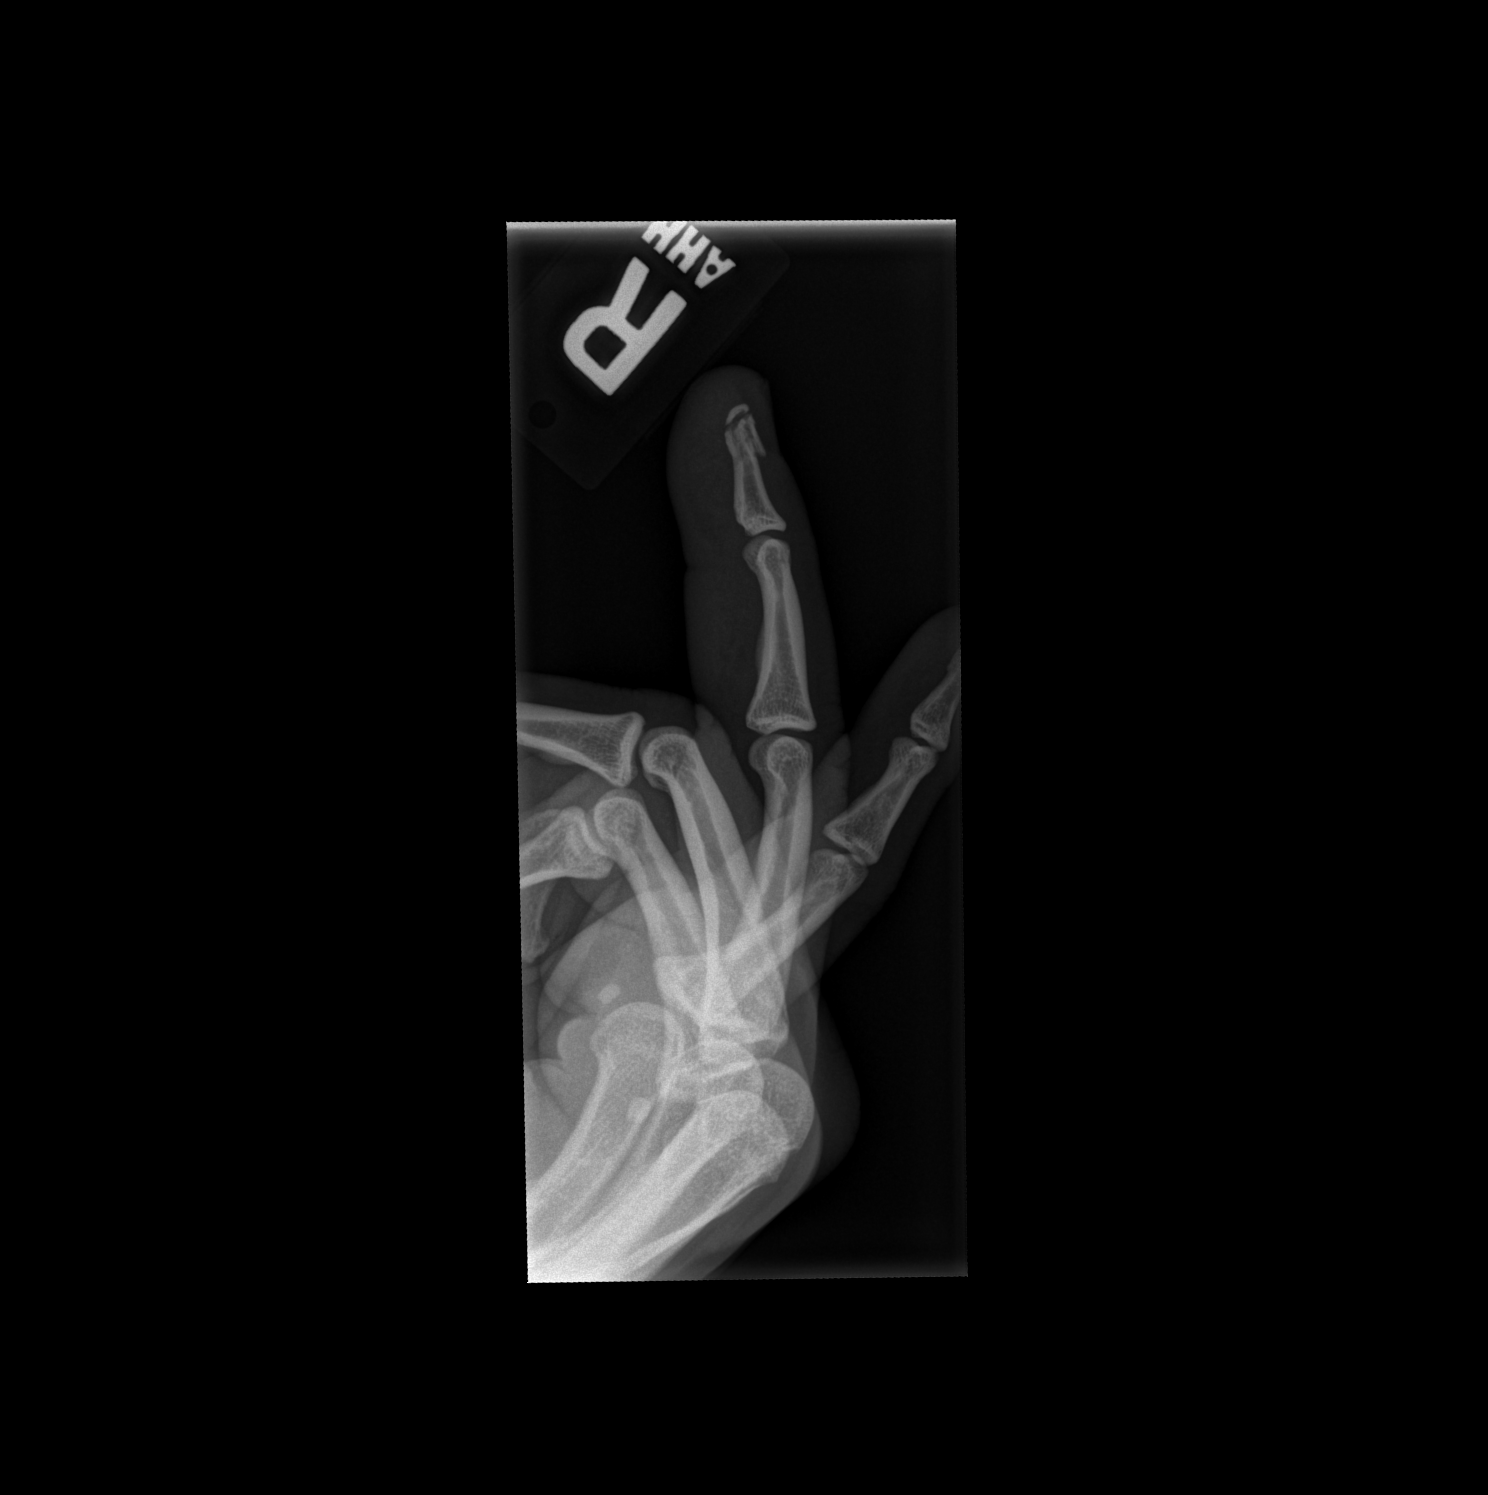

[3 of 3 positions shown; findings below may reference images not displayed]

FINDINGS: Comminuted fracture of the distal tuft of the distal phalanx of the
right ring finger. There is associated surrounding soft tissue
swelling. The remaining visualized bones and joints are
unremarkable.
IMPRESSION: Comminuted fracture of the tuft of the distal phalanx of the right
ring finger.

## 2021-09-29 DIAGNOSIS — E7800 Pure hypercholesterolemia, unspecified: Secondary | ICD-10-CM | POA: Diagnosis not present

## 2021-09-29 DIAGNOSIS — Z0000 Encounter for general adult medical examination without abnormal findings: Secondary | ICD-10-CM | POA: Diagnosis not present

## 2021-09-29 DIAGNOSIS — E039 Hypothyroidism, unspecified: Secondary | ICD-10-CM | POA: Diagnosis not present

## 2021-09-29 DIAGNOSIS — R7309 Other abnormal glucose: Secondary | ICD-10-CM | POA: Diagnosis not present

## 2021-10-06 DIAGNOSIS — R7301 Impaired fasting glucose: Secondary | ICD-10-CM | POA: Diagnosis not present

## 2021-10-06 DIAGNOSIS — Z0000 Encounter for general adult medical examination without abnormal findings: Secondary | ICD-10-CM | POA: Diagnosis not present

## 2021-10-06 DIAGNOSIS — R200 Anesthesia of skin: Secondary | ICD-10-CM | POA: Diagnosis not present

## 2021-10-06 DIAGNOSIS — R17 Unspecified jaundice: Secondary | ICD-10-CM | POA: Diagnosis not present

## 2021-10-06 DIAGNOSIS — E7800 Pure hypercholesterolemia, unspecified: Secondary | ICD-10-CM | POA: Diagnosis not present

## 2023-04-25 ENCOUNTER — Emergency Department (HOSPITAL_COMMUNITY): Payer: 59

## 2023-04-25 ENCOUNTER — Other Ambulatory Visit: Payer: Self-pay

## 2023-04-25 ENCOUNTER — Encounter (HOSPITAL_COMMUNITY): Payer: Self-pay | Admitting: Emergency Medicine

## 2023-04-25 ENCOUNTER — Emergency Department (HOSPITAL_COMMUNITY)
Admission: EM | Admit: 2023-04-25 | Discharge: 2023-04-25 | Disposition: A | Discharge status: Home / Self Care | Payer: 59 | Attending: Emergency Medicine | Admitting: Emergency Medicine

## 2023-04-25 DIAGNOSIS — R1084 Generalized abdominal pain: Secondary | ICD-10-CM | POA: Diagnosis not present

## 2023-04-25 DIAGNOSIS — R61 Generalized hyperhidrosis: Secondary | ICD-10-CM | POA: Insufficient documentation

## 2023-04-25 DIAGNOSIS — R1030 Lower abdominal pain, unspecified: Secondary | ICD-10-CM | POA: Diagnosis present

## 2023-04-25 DIAGNOSIS — R197 Diarrhea, unspecified: Secondary | ICD-10-CM | POA: Insufficient documentation

## 2023-04-25 DIAGNOSIS — D696 Thrombocytopenia, unspecified: Secondary | ICD-10-CM | POA: Insufficient documentation

## 2023-04-25 DIAGNOSIS — R112 Nausea with vomiting, unspecified: Secondary | ICD-10-CM | POA: Insufficient documentation

## 2023-04-25 LAB — BASIC METABOLIC PANEL
Anion gap: 10 (ref 5–15)
BUN: 14 mg/dL (ref 6–20)
CO2: 21 mmol/L — ABNORMAL LOW (ref 22–32)
Calcium: 7.9 mg/dL — ABNORMAL LOW (ref 8.9–10.3)
Chloride: 103 mmol/L (ref 98–111)
Creatinine, Ser: 0.93 mg/dL (ref 0.61–1.24)
GFR, Estimated: >60 mL/min (ref >60)
Glucose, Bld: 132 mg/dL — ABNORMAL HIGH (ref 70–99)
Potassium: 3.4 mmol/L — ABNORMAL LOW (ref 3.5–5.1)
Sodium: 134 mmol/L — ABNORMAL LOW (ref 135–145)

## 2023-04-25 LAB — CBC
HCT: 44.5 % (ref 39.0–52.0)
Hemoglobin: 15 g/dL (ref 13.0–17.0)
MCH: 28.9 pg (ref 26.0–34.0)
MCHC: 33.7 g/dL (ref 30.0–36.0)
MCV: 85.7 fL (ref 80.0–100.0)
Platelets: 139 10*3/uL — ABNORMAL LOW (ref 150–400)
RBC: 5.19 MIL/uL (ref 4.22–5.81)
RDW: 11.9 % (ref 11.5–15.5)
WBC: 4 10*3/uL (ref 4.0–10.5)
nRBC: 0 % (ref 0.0–0.2)

## 2023-04-25 LAB — LIPASE, BLOOD: Lipase: 30 U/L (ref 11–51)

## 2023-04-25 LAB — TROPONIN I (HIGH SENSITIVITY)
Troponin I (High Sensitivity): <2 ng/L (ref <18)
Troponin I (High Sensitivity): <2 ng/L (ref <18)

## 2023-04-25 MED ORDER — DICYCLOMINE HCL 10 MG PO CAPS
10.0000 mg | ORAL_CAPSULE | Freq: Once | ORAL | Status: AC
  Administered 2023-04-25: 10 mg via ORAL
  Filled 2023-04-25: qty 1

## 2023-04-25 MED ORDER — DICYCLOMINE HCL 20 MG PO TABS: 20.0000 mg | ORAL_TABLET | Freq: Two times a day (BID) | ORAL | 0 refills | Status: AC

## 2023-04-25 MED ORDER — ONDANSETRON 4 MG PO TBDP: 4.0000 mg | ORAL_TABLET | Freq: Three times a day (TID) | ORAL | 0 refills | Status: AC | PRN

## 2023-04-25 MED ORDER — IOHEXOL 350 MG/ML SOLN
75.0000 mL | Freq: Once | INTRAVENOUS | Status: AC | PRN
  Administered 2023-04-25: 75 mL via INTRAVENOUS

## 2023-04-25 MED ORDER — ACETAMINOPHEN 500 MG PO TABS
1000.0000 mg | ORAL_TABLET | Freq: Once | ORAL | Status: AC
  Administered 2023-04-25: 1000 mg via ORAL
  Filled 2023-04-25: qty 2

## 2023-04-25 MED ORDER — KETOROLAC TROMETHAMINE 15 MG/ML IJ SOLN
15.0000 mg | Freq: Once | INTRAMUSCULAR | Status: AC
  Administered 2023-04-25: 15 mg via INTRAVENOUS
  Filled 2023-04-25: qty 1

## 2023-04-25 NOTE — Discharge Instructions (Addendum)
Thank you for coming to [facility] Emergency Department. You were seen for nausea/vomiting and abdominal pain. We did an exam, labs, and imaging, and these showed no acute findings. You likely are experiencing abdominal cramps from possible gastroenteritis or food poisoning. We have prescribed bentyl which you can take twice per day for abdominal pain. You can also take zofran 4 mg under the tongue once every 6-8 hours for nausea/vomiting. You can also take 1g of tylenol every 8 hours. Please stay well hydrated.   Your platelet count was mildly low. This can be followed up and rechecked by your primary care provider.  Please follow up with your primary care provider within 1 week.   Do not hesitate to return to the ED or call 911 if you experience: -Worsening symptoms -Nausea/vomiting so severe you cannot manage it at home -Inability to have a bowel movement -Bright red blood in your stool or dark/tarry stools -Lightheadedness, passing out -Fevers/chills -Anything else that concerns you

## 2023-04-25 NOTE — ED Triage Notes (Signed)
Patient reports possible food poisoning/stomach virus that started yesterday and reported he started feeling better overnight.  Patient reports that this morning on the way to work, the abdominal pain returned and was radiating into his chest.  Patient reports he was diaphoretic, nauseous and pale when this happened.  Patient reports pain has since went away.  Patient has had gallbladder removed.

## 2023-04-25 NOTE — ED Provider Notes (Signed)
[place] [facility] AT [facility] Provider Note   CSN: [id number] Arrival date & time: 04/25/23  0620     History  Chief Complaint  Patient presents with   Abdominal Pain   Chest Pain    Gordon Robertson is a 37 y.o. male with history of cholecystectomy presents with abdominal pain.   Patient reports possible food poisoning/stomach virus that started yesterday with abdominal discomfort and nausea vomiting and nonbloody diarrhea and reported he started feeling better overnight.  Patient reports that this morning on the way to work, the abdominal pain returned, located mostly in the lower belly, and was radiating into his lower chest.  Patient reports he was diaphoretic, nauseous and pale when this happened.  Patient reports pain has since went away.  It is come back a couple of times and is rated severe when it happens.  Otherwise his belly just feels sore.  Patient has had gallbladder removed, and this feels similar to the pain that he experienced when that happened.  He denies any fevers or chills, other abdominal surgeries, urinary symptoms or back/flank pain.     Abdominal Pain Associated symptoms: chest pain   Chest Pain Associated symptoms: abdominal pain        Home Medications Prior to Admission medications   Medication Sig Start Date End Date Taking? Authorizing Provider  dicyclomine (BENTYL) 20 MG tablet Take 1 tablet (20 mg total) by mouth 2 (two) times daily. 04/25/23  Yes Loetta Rough, MD  ondansetron (ZOFRAN-ODT) 4 MG disintegrating tablet Take 1 tablet (4 mg total) by mouth every 8 (eight) hours as needed for nausea or vomiting. 04/25/23  Yes Loetta Rough, MD  amoxicillin-clavulanate (AUGMENTIN) 875-125 MG tablet Take 1 tablet by mouth every 12 (twelve) hours. 07/13/16   Tyrone Nine, MD  aspirin 325 MG EC tablet Take 650 mg by mouth every 6 (six) hours as needed for pain.    [provider]  calcium carbonate (TUMS) 500 MG chewable  tablet Chew 2 tablets by mouth daily as needed for heartburn.    [provider]  famotidine (PEPCID) 20 MG tablet Take 20 mg by mouth daily as needed for heartburn.    [provider]  HYDROcodone-acetaminophen (NORCO/VICODIN) 5-325 MG per tablet Take 1-2 tablets by mouth every 6 (six) hours as needed for pain. 10/13/13   Piepenbrink, Victorino Dike, PA-C  ibuprofen (ADVIL,MOTRIN) 200 MG tablet Take 200 mg by mouth every 6 (six) hours as needed for pain.    [provider]  omeprazole (PRILOSEC) 20 MG capsule Take 1 capsule (20 mg total) by mouth daily. 10/13/13   Piepenbrink, Victorino Dike, PA-C  ranitidine (ZANTAC) 150 MG tablet Take 150 mg by mouth daily as needed for heartburn. heartburn    [provider]  sucralfate (CARAFATE) 1 GM/10ML suspension Take 10 mLs (1 g total) by mouth 4 (four) times daily -  with meals and at bedtime. 10/13/13   Piepenbrink, Victorino Dike, PA-C      Allergies    Patient has no known allergies.    Review of Systems   Review of Systems  Cardiovascular:  Positive for chest pain.  Gastrointestinal:  Positive for abdominal pain.   Review of systems Negative for f/c.  A 10 point review of systems was performed and is negative unless otherwise reported in HPI.  Physical Exam Updated Vital Signs BP 127/77 (BP Location: Right Arm)   Pulse 63   Temp 97.8 F (36.6 C) (Oral)  Resp 15   Ht 6' (1.829 m)   Wt 104.3 kg   SpO2 99%   BMI 31.19 kg/m  Physical Exam General: Normal appearing male, lying in bed.  HEENT: Sclera anicteric, MMM, trachea midline.  Cardiology: RRR, no murmurs/rubs/gallops.  Resp: Normal respiratory rate and effort. CTAB, no wheezes, rhonchi, crackles.  Abd: Soft, non-tender, non-distended. No rebound tenderness or guarding.  GU: Deferred. MSK: No peripheral edema or signs of trauma.  Skin: warm, dry. Back: No CVA tenderness Neuro: A&Ox4, CNs II-XII grossly intact. MAEs. Sensation grossly intact.  Psych: Normal mood  and affect.   ED Results / Procedures / Treatments   Labs (all labs ordered are listed, but only abnormal results are displayed) Labs Reviewed  BASIC METABOLIC PANEL - Abnormal; Notable for the following components:      Result Value   Sodium 134 (*)    Potassium 3.4 (*)    CO2 21 (*)    Glucose, Bld 132 (*)    Calcium 7.9 (*)    All other components within normal limits  CBC - Abnormal; Notable for the following components:   Platelets 139 (*)    All other components within normal limits  LIPASE, BLOOD  TROPONIN I (HIGH SENSITIVITY)  TROPONIN I (HIGH SENSITIVITY)    EKG EKG Interpretation  Date/Time:  Wednesday Apr 25 2023 06:27:23 EDT Ventricular Rate:  70 PR Interval:  200 QRS Duration: 106 QT Interval:  398 QTC Calculation: 429 R Axis:   80 Text Interpretation: Normal sinus rhythm Possible Left atrial enlargement  Baseline artifact limits interpretation Confirmed by Vivi Barrack [phone]) on 04/25/2023 8:45:47 AM  Radiology No results found.  Procedures Procedures    Medications Ordered in ED Medications  iohexol (OMNIPAQUE) 350 MG/ML injection 75 mL (75 mLs Intravenous Contrast Given 04/25/23 0933)  ketorolac (TORADOL) 15 MG/ML injection 15 mg (15 mg Intravenous Given 04/25/23 1018)  dicyclomine (BENTYL) capsule 10 mg (10 mg Oral Given 04/25/23 1017)  acetaminophen (TYLENOL) tablet 1,000 mg (1,000 mg Oral Given 04/25/23 1018)    ED Course/ Medical Decision Making/ A&P                          Medical Decision Making Amount and/or Complexity of Data Reviewed Labs: ordered. Decision-making details documented in ED Course. Radiology: ordered. Decision-making details documented in ED Course.  Risk OTC drugs. Prescription drug management.   MDM:    This patient presents to the ED for concern of abdominal pain/nausea/vomiting, this involves an extensive number of treatment options, and is a complaint that carries with it a high risk of complications and  morbidity.  Patient's symptoms have already begun to improve. He is very well-appearing/HDS.    For DDX for abdominal pain includes but is not limited to:  Abdominal exam without peritoneal signs. No evidence of acute abdomen at this time. Patient was concerned for food poisoning symptoms, and currently he is asymptomatic in the room. He has no severe symptoms at this time to indicate stool testing. Low suspicion for acute hepatobiliary disease as patient has h/o cholecystectomy and no RUQ pain to palpation; acute pancreatitis (neg lipase), PUD (including gastric perforation), acute appendicitis, vascular catastrophe, bowel obstruction, viscus perforation, or diverticulitis. Shared decision making with patient who would like to move forward with CT abd pelvis.    Clinical Course as of 05/09/23 1924  Wed Apr 25, 2023  6045 Platelets(!): 139 Mild thrombocytopenia [HN]  0724 DG Chest 2 View  FINDINGS: Lung volumes are normal. No consolidative airspace disease. No pleural effusions. No pneumothorax. No pulmonary nodule or mass noted. Pulmonary vasculature and the cardiomediastinal silhouette are within normal limits.  IMPRESSION: No radiographic evidence of acute cardiopulmonary disease.   [HN]  0732 Platelets(!): 139 New low platelets [HN]  0737 Troponin I (High Sensitivity): <2 [HN]  0737 Lipase: 30 [HN]  0737 WBC: 4.0 [HN]  0737 Hemoglobin: 15.0 [HN]  1011 Troponin I (High Sensitivity): <2 Repeat flat [HN]  1011 CT ABDOMEN PELVIS W CONTRAST Neg [HN]    Clinical Course User Index [HN] Loetta Rough, MD    Labs: I Ordered, and personally interpreted labs.  The pertinent results include:  those listed above  Imaging Studies ordered: I ordered imaging studies including CXR, CT abd pelvis I independently visualized and interpreted imaging. I agree with the radiologist interpretation  Additional history obtained from chart review.    Reevaluation: After the interventions  noted above, I reevaluated the patient and found that they have :improved  Social Determinants of Health: Patient lives independently   Disposition:  Pt with new mild thrombocytopenia, made aware of this finding and instructed to f/u with PCP to have this value rechecked. Otherwise he is feeling well, has taken PO, neg w/u. Will DC w/ PCP f/u and instructions to stay well hydrated at home. Rx'd bentyl and zofran. DC w/ discharge instructions/return precautions. All questions answered to patient's satisfaction.    Co morbidities that complicate the patient evaluation History reviewed. No pertinent past medical history.   Medicines Meds ordered this encounter  Medications   iohexol (OMNIPAQUE) 350 MG/ML injection 75 mL   ketorolac (TORADOL) 15 MG/ML injection 15 mg   dicyclomine (BENTYL) capsule 10 mg   acetaminophen (TYLENOL) tablet 1,000 mg   dicyclomine (BENTYL) 20 MG tablet    Sig: Take 1 tablet (20 mg total) by mouth 2 (two) times daily.    Dispense:  20 tablet    Refill:  0   ondansetron (ZOFRAN-ODT) 4 MG disintegrating tablet    Sig: Take 1 tablet (4 mg total) by mouth every 8 (eight) hours as needed for nausea or vomiting.    Dispense:  20 tablet    Refill:  0    I have reviewed the patients home medicines and have made adjustments as needed  Problem List / ED Course: Problem List Items Addressed This Visit   None Visit Diagnoses     Generalized abdominal pain    -  Primary                   This note was created using dictation software, which may contain spelling or grammatical errors.    Loetta Rough, MD 05/09/23 [phone]

## 2023-04-25 NOTE — ED Notes (Signed)
Patient to xray.

## 2023-04-25 NOTE — ED Notes (Signed)
Pt is a&ox4, pwd. Pt currently states that the previous chest/abdomen pain is now just a dull ache at a 1/10. Pt has been attached to monitor/vitals. Call light within reach. Pt did not want side rails up and is not a fall risk.

## 2023-04-25 NOTE — ED Notes (Signed)
Back from CT. Reattached to monitor/vitals. No new complaints.
